# Patient Record
Sex: Female | Born: 1995 | Race: Black or African American | Hispanic: No | Marital: Single | Smoking: Never smoker
Health system: Southern US, Community
[De-identification: ages and names within clinical notes are randomized; demographics above are authoritative.]

## PROBLEM LIST (undated history)

## (undated) DIAGNOSIS — Z3490 Encounter for supervision of normal pregnancy, unspecified, unspecified trimester: Secondary | ICD-10-CM

---

## 2014-09-01 ENCOUNTER — Emergency Department (HOSPITAL_COMMUNITY)
Admission: EM | Admit: 2014-09-01 | Discharge: 2014-09-01 | Disposition: A | Discharge status: Home / Self Care | Payer: Managed Care, Other (non HMO) | Attending: Emergency Medicine | Admitting: Emergency Medicine

## 2014-09-01 ENCOUNTER — Encounter (HOSPITAL_COMMUNITY): Payer: Self-pay | Admitting: Emergency Medicine

## 2014-09-01 DIAGNOSIS — R220 Localized swelling, mass and lump, head: Secondary | ICD-10-CM | POA: Diagnosis not present

## 2014-09-01 DIAGNOSIS — T781XXA Other adverse food reactions, not elsewhere classified, initial encounter: Secondary | ICD-10-CM

## 2014-09-01 DIAGNOSIS — X58XXXA Exposure to other specified factors, initial encounter: Secondary | ICD-10-CM | POA: Insufficient documentation

## 2014-09-01 MED ORDER — PREDNISONE 10 MG PO TABS: ORAL_TABLET | ORAL | Status: AC

## 2014-09-01 MED ORDER — DIPHENHYDRAMINE HCL 25 MG PO TABS: 25.0000 mg | ORAL_TABLET | Freq: Three times a day (TID) | ORAL | Status: AC

## 2014-09-01 MED ORDER — FAMOTIDINE 20 MG PO TABS
20.0000 mg | ORAL_TABLET | Freq: Once | ORAL | Status: AC
  Administered 2014-09-01: 20 mg via ORAL
  Filled 2014-09-01: qty 1

## 2014-09-01 MED ORDER — FAMOTIDINE 20 MG PO TABS: 20.0000 mg | ORAL_TABLET | Freq: Two times a day (BID) | ORAL | Status: AC

## 2014-09-01 MED ORDER — PREDNISONE 20 MG PO TABS
60.0000 mg | ORAL_TABLET | Freq: Once | ORAL | Status: AC
  Administered 2014-09-01: 60 mg via ORAL
  Filled 2014-09-01: qty 3

## 2014-09-01 NOTE — Discharge Instructions (Signed)
Angioedema °Angioedema is a sudden swelling of tissues, often of the skin. It can occur on the face or genitals or in the abdomen or other body parts. The swelling usually develops over a short period and gets better in 24 to 48 hours. It often begins during the night and is found when the person wakes up. The person may also get red, itchy patches of skin (hives). Angioedema can be dangerous if it involves swelling of the air passages.  °Depending on the cause, episodes of angioedema may only happen once, come back in unpredictable patterns, or repeat for several years and then gradually fade away.  °CAUSES  °Angioedema can be caused by an allergic reaction to various triggers. It can also result from nonallergic causes, including reactions to drugs, immune system disorders, viral infections, or an abnormal gene that is passed to you from your parents (hereditary). For some people with angioedema, the cause is unknown.  °Some things that can trigger angioedema include:  °· Foods.   °· Medicines, such as ACE inhibitors, ARBs, nonsteroidal anti-inflammatory agents, or estrogen.   °· Latex.   °· Animal saliva.   °· Insect stings.   °· Dyes used in X-rays.   °· Mild injury.   °· Dental work. °· Surgery. °· Stress.   °· Sudden changes in temperature.   °· Exercise. °SIGNS AND SYMPTOMS  °· Swelling of the skin. °· Hives. If these are present, there is also intense itching. °· Redness in the affected area.   °· Pain in the affected area. °· Swollen lips or tongue. °· Breathing problems. This may happen if the air passages swell. °· Wheezing. °If internal organs are involved, there may be:  °· Nausea.   °· Abdominal pain.   °· Vomiting.   °· Difficulty swallowing.   °· Difficulty passing urine. °DIAGNOSIS  °· Your health care provider will examine the affected area and take a medical and family history. °· Various tests may be done to help determine the cause. Tests may include: °¨ Allergy skin tests to see if the problem  is an allergic reaction.   °¨ Blood tests to check for hereditary angioedema.   °¨ Tests to check for underlying diseases that could cause the condition.   °· A review of your medicines, including over-the-counter medicines, may be done. °TREATMENT  °Treatment will depend on the cause of the angioedema. Possible treatments include:  °· Removal of anything that triggered the condition (such as stopping certain medicines).   °· Medicines to treat symptoms or prevent attacks. Medicines given may include:   °¨ Antihistamines.   °¨ Epinephrine injection.   °¨ Steroids.   °· Hospitalization may be required for severe attacks. If the air passages are affected, it can be an emergency. Tubes may need to be placed to keep the airway open. °HOME CARE INSTRUCTIONS  °· Take all medicines as directed by your health care provider. °· If you were given medicines for emergency allergy treatment, always carry them with you. °· Wear a medical bracelet as directed by your health care provider.   °· Avoid known triggers. °SEEK MEDICAL CARE IF:  °· You have repeat attacks of angioedema.   °· Your attacks are more frequent or more severe despite preventive measures.   °· You have hereditary angioedema and are considering having children. It is important to discuss with your health care provider the risks of passing the condition on to your children. °SEEK IMMEDIATE MEDICAL CARE IF:  °· You have severe swelling of the mouth, tongue, or lips. °· You have difficulty breathing.   °· You have difficulty swallowing.   °· You faint. °MAKE   SURE YOU: °· Understand these instructions. °· Will watch your condition. °· Will get help right away if you are not doing well or get worse. °Document Released: 01/05/2002 Document Revised: 03/13/2014 Document Reviewed: 06/20/2013 °Exit[employer]® Patient Information ©2015 [employer]. This information is not intended to replace advice given to you by your health care provider. Make sure you discuss any questions  you have with your health care provider. ° °

## 2014-09-01 NOTE — ED Provider Notes (Signed)
CSN: [id number]     Arrival date & time 09/01/14  1651 History  This chart was scribed for non-physician practitioner, Felicie Mornavid Johna Kearl, NP-C working with Elwin MochaBlair Walden, MD by Luisa DagoPriscilla Tutu, ED scribe. This patient was seen in room TR08C/TR08C and the patient's care was started at 5:08 PM.     Chief Complaint  Patient presents with  . Allergic Reaction   Patient is a 18 y.o. female presenting with allergic reaction. The history is provided by the patient. No language interpreter was used.  Allergic Reaction Presenting symptoms: swelling   Presenting symptoms: no difficulty breathing, no difficulty swallowing, no itching, no rash and no wheezing   Severity:  Mild Prior allergic episodes:  Food/nut allergies Context: food   Context: no animal exposure, no chemicals, no cosmetics, no grass, no insect bite/sting and no jewelry/metal   Context comment:  Shrimp sauce Relieved by:  Nothing Worsened by:  Nothing tried Ineffective treatments:  Antihistamines  HPI Comments: Jenna Mcneil is a 18 y.o. female who presents to the Emergency Department complaining of an allergic reaction which she first noted today upon waking. Pt states that yesterday she ate a shrimp based sauce which caused her bottom lip to swell. She reports taking Benadryl with minimal relief. Denies any SOB, difficulty breathing, nausea, emesis, abdominal pain, or fever.  History reviewed. No pertinent past medical history. History reviewed. No pertinent past surgical history. History reviewed. No pertinent family history. History  Substance Use Topics  . Smoking status: Never Smoker   . Smokeless tobacco: Not on file  . Alcohol Use: Not on file   OB History   Grav Para Term Preterm Abortions TAB SAB Ect Mult Living                 Review of Systems  Constitutional: Negative for fever, chills, diaphoresis, appetite change and fatigue.  HENT: Positive for facial swelling. Negative for mouth sores, sore throat and trouble  swallowing.   Eyes: Negative for visual disturbance.  Respiratory: Negative for cough, chest tightness, shortness of breath and wheezing.   Cardiovascular: Negative for chest pain.  Gastrointestinal: Negative for nausea, vomiting, abdominal pain, diarrhea and abdominal distention.  Endocrine: Negative for polydipsia, polyphagia and polyuria.  Genitourinary: Negative for dysuria, frequency and hematuria.  Musculoskeletal: Negative for gait problem.  Skin: Negative for color change, itching, pallor and rash.  Neurological: Negative for dizziness, syncope, light-headedness and headaches.  Hematological: Does not bruise/bleed easily.  Psychiatric/Behavioral: Negative for behavioral problems and confusion.  All other systems reviewed and are negative.     Allergies  Shrimp  Home Medications   Prior to Admission medications   Not on File   Triage Vitals: BP 127/72  Pulse 100  Temp(Src) 98.9 F (37.2 C) (Oral)  Resp 14  Ht 5\' 9"  (1.753 m)  Wt 145 lb (65.772 kg)  BMI 21.40 kg/m2  SpO2 99%  LMP 08/13/2014  Physical Exam  Nursing note and vitals reviewed. Constitutional: She appears well-developed and well-nourished. No distress.  HENT:  Head: Normocephalic and atraumatic.  No facial swelling noted.  Eyes: Conjunctivae are normal. Right eye exhibits no discharge. Left eye exhibits no discharge.  Neck: Neck supple.  Cardiovascular: Normal rate, regular rhythm and normal heart sounds.  Exam reveals no gallop and no friction rub.   No murmur heard. Pulmonary/Chest: Effort normal and breath sounds normal. No respiratory distress.  Abdominal: Soft. She exhibits no distension. There is no tenderness.  Musculoskeletal: She exhibits no edema and no tenderness.  Neurological: She is alert.  Skin: Skin is warm and dry.  Psychiatric: She has a normal mood and affect. Her behavior is normal. Thought content normal.    ED Course  Procedures (including critical care time)  DIAGNOSTIC  STUDIES: Oxygen Saturation is 99% on RA, normal by my interpretation.    COORDINATION OF CARE: 5:13 PM- Pt advised of plan for treatment and pt agrees.  Medications  predniSONE (DELTASONE) tablet 60 mg (not administered)  famotidine (PEPCID) tablet 20 mg (not administered)     Labs Review Labs Reviewed - No data to display  Imaging Review No results found.   EKG Interpretation None     Patient presents with mild angioedema of lower lip after eating something with a shrimp-based sauce yesterday. History of shrimp allergy.  Never had shortness of breath, difficulty swallowing, tongue swelling.  Will treat with prednisone, pepcid.  Continuation at home along with benadryl.  Return precautions discussed. MDM   Final diagnoses:  None    Mild allergic reaction to food presenting with minimal lower lip angioedema.  I personally performed the services described in this documentation, which was scribed in my presence. The recorded information has been reviewed and is accurate.    Jimmye Normanavid John Trini Christiansen, NP 09/02/14 [phone]

## 2014-09-01 NOTE — ED Notes (Signed)
Pt told to return with new or worsening symptoms.

## 2014-09-01 NOTE — ED Notes (Signed)
Pt in stating she ate some shrimp yesterday and today she woke up with a swollen upper lip, pt states she took benadryl at home but symptoms continues, no other symptoms

## 2014-09-02 NOTE — ED Provider Notes (Signed)
Medical screening examination/treatment/procedure(s) were performed by non-physician practitioner and as supervising physician I was immediately available for consultation/collaboration.   EKG Interpretation None        Elwin MochaBlair Belinda Bringhurst, MD 09/02/14 [phone]

## 2015-05-02 ENCOUNTER — Telehealth: Payer: Self-pay | Admitting: *Deleted

## 2015-05-02 ENCOUNTER — Emergency Department (HOSPITAL_COMMUNITY): Payer: Medicaid Other

## 2015-05-02 ENCOUNTER — Emergency Department (HOSPITAL_COMMUNITY)
Admission: EM | Admit: 2015-05-02 | Discharge: 2015-05-02 | Disposition: A | Discharge status: Home / Self Care | Payer: Medicaid Other | Attending: Emergency Medicine | Admitting: Emergency Medicine

## 2015-05-02 ENCOUNTER — Encounter (HOSPITAL_COMMUNITY): Payer: Self-pay | Admitting: Emergency Medicine

## 2015-05-02 DIAGNOSIS — O209 Hemorrhage in early pregnancy, unspecified: Secondary | ICD-10-CM | POA: Insufficient documentation

## 2015-05-02 DIAGNOSIS — O418X11 Other specified disorders of amniotic fluid and membranes, first trimester, fetus 1: Secondary | ICD-10-CM | POA: Diagnosis not present

## 2015-05-02 DIAGNOSIS — Z3A09 9 weeks gestation of pregnancy: Secondary | ICD-10-CM | POA: Insufficient documentation

## 2015-05-02 DIAGNOSIS — Z79899 Other long term (current) drug therapy: Secondary | ICD-10-CM | POA: Diagnosis not present

## 2015-05-02 DIAGNOSIS — O468X1 Other antepartum hemorrhage, first trimester: Secondary | ICD-10-CM

## 2015-05-02 DIAGNOSIS — O418X10 Other specified disorders of amniotic fluid and membranes, first trimester, not applicable or unspecified: Secondary | ICD-10-CM

## 2015-05-02 DIAGNOSIS — O4690 Antepartum hemorrhage, unspecified, unspecified trimester: Secondary | ICD-10-CM

## 2015-05-02 LAB — CBC WITH DIFFERENTIAL/PLATELET
BASOS PCT: 0 % (ref 0–1)
Basophils Absolute: 0 10*3/uL (ref 0.0–0.1)
EOS PCT: 1 % (ref 0–5)
Eosinophils Absolute: 0.1 10*3/uL (ref 0.0–0.7)
HCT: 33.3 % — ABNORMAL LOW (ref 36.0–46.0)
HEMOGLOBIN: 11 g/dL — AB (ref 12.0–15.0)
LYMPHS ABS: 2.8 10*3/uL (ref 0.7–4.0)
Lymphocytes Relative: 31 % (ref 12–46)
MCH: 23.9 pg — AB (ref 26.0–34.0)
MCHC: 33 g/dL (ref 30.0–36.0)
MCV: 72.2 fL — AB (ref 78.0–100.0)
MONO ABS: 0.8 10*3/uL (ref 0.1–1.0)
Monocytes Relative: 9 % (ref 3–12)
Neutro Abs: 5.2 10*3/uL (ref 1.7–7.7)
Neutrophils Relative %: 59 % (ref 43–77)
Platelets: 221 10*3/uL (ref 150–400)
RBC: 4.61 MIL/uL (ref 3.87–5.11)
RDW: 15 % (ref 11.5–15.5)
WBC: 8.9 10*3/uL (ref 4.0–10.5)

## 2015-05-02 LAB — WET PREP, GENITAL
CLUE CELLS WET PREP: NONE SEEN
TRICH WET PREP: NONE SEEN
YEAST WET PREP: NONE SEEN

## 2015-05-02 LAB — GC/CHLAMYDIA PROBE AMP (~~LOC~~) NOT AT ARMC
CHLAMYDIA, DNA PROBE: NEGATIVE
NEISSERIA GONORRHEA: NEGATIVE

## 2015-05-02 LAB — HIV ANTIBODY (ROUTINE TESTING W REFLEX): HIV Screen 4th Generation wRfx: NONREACTIVE

## 2015-05-02 LAB — HCG, SERUM, QUALITATIVE: Preg, Serum: POSITIVE — AB

## 2015-05-02 LAB — HCG, QUANTITATIVE, PREGNANCY: hCG, Beta Chain, Quant, S: 80148 m[IU]/mL — ABNORMAL HIGH (ref <5)

## 2015-05-02 LAB — ABO/RH: ABO/RH(D): O POS

## 2015-05-02 LAB — RPR: RPR: NONREACTIVE

## 2015-05-02 NOTE — ED Notes (Addendum)
Patient transported to US 

## 2015-05-02 NOTE — Discharge Instructions (Signed)
Your ultrasound today shows a subchorionic hematoma which is the source of your bleeding.  Your pregnancy at  this time appears to be viable with a good strong heart beat seen on ultrasound.  You are at higher risk for miscarriage, however.  No tampons, douching, sexual intercourse until you have been cleared by her OB.  Contact them in the morning for follow-up.   Pelvic Rest Pelvic rest is sometimes recommended for women when:   The placenta is partially or completely covering the opening of the cervix (placenta previa).  There is bleeding between the uterine wall and the amniotic sac in the first trimester (subchorionic hemorrhage).  The cervix begins to open without labor starting (incompetent cervix, cervical insufficiency).  The labor is too early (preterm labor). HOME CARE INSTRUCTIONS  Do not have sexual intercourse, stimulation, or an orgasm.  Do not use tampons, douche, or put anything in the vagina.  Do not lift anything over 10 pounds (4.5 kg).  Avoid strenuous activity or straining your pelvic muscles. SEEK MEDICAL CARE IF:  You have any vaginal bleeding during pregnancy. Treat this as a potential emergency.  You have cramping pain felt low in the stomach (stronger than menstrual cramps).  You notice vaginal discharge (watery, mucus, or bloody).  You have a low, dull backache.  There are regular contractions or uterine tightening. SEEK IMMEDIATE MEDICAL CARE IF: You have vaginal bleeding and have placenta previa.  Document Released: 02/21/2011 Document Revised: 01/19/2012 Document Reviewed: 02/21/2011 [facility] Patient Information 2015 [place], [place]. This information is not intended to replace advice given to you by your health care provider. Make sure you discuss any questions you have with your health care provider.  Subchorionic Hematoma A subchorionic hematoma is a gathering of blood between the outer wall of the placenta and the inner wall of the womb  (uterus). The placenta is the organ that connects the fetus to the wall of the uterus. The placenta performs the feeding, breathing (oxygen to the fetus), and waste removal (excretory work) of the fetus.  Subchorionic hematoma is the most common abnormality found on a result from ultrasonography done during the first trimester or early second trimester of pregnancy. If there has been little or no vaginal bleeding, early small hematomas usually shrink on their own and do not affect your baby or pregnancy. The blood is gradually absorbed over 1-2 weeks. When bleeding starts later in pregnancy or the hematoma is larger or occurs in an older pregnant woman, the outcome may not be as good. Larger hematomas may get bigger, which increases the chances for miscarriage. Subchorionic hematoma also increases the risk of premature detachment of the placenta from the uterus, preterm (premature) labor, and stillbirth. HOME CARE INSTRUCTIONS  Stay on bed rest if your health care provider recommends this. Although bed rest will not prevent more bleeding or prevent a miscarriage, your health care provider may recommend bed rest until you are advised otherwise.  Avoid heavy lifting (more than 10 lb [4.5 kg]), exercise, sexual intercourse, or douching as directed by your health care provider.  Keep track of the number of pads you use each day and how soaked (saturated) they are. Write down this information.  Do not use tampons.  Keep all follow-up appointments as directed by your health care provider. Your health care provider may ask you to have follow-up blood tests or ultrasound tests or both. SEEK IMMEDIATE MEDICAL CARE IF:  You have severe cramps in your stomach, back, abdomen, or pelvis.  You have a fever.  You pass large clots or tissue. Save any tissue for your health care provider to look at.  Your bleeding increases or you become lightheaded, feel weak, or have fainting episodes. Document Released:  02/11/2007 Document Revised: 03/13/2014 Document Reviewed: 05/26/2013 [facility] Patient Information 2015 [place], [place]. This information is not intended to replace advice given to you by your health care provider. Make sure you discuss any questions you have with your health care provider.  Vaginal Bleeding During Pregnancy, First Trimester A small amount of bleeding (spotting) from the vagina is common in early pregnancy. Sometimes the bleeding is normal and is not a problem, and sometimes it is a sign of something serious. Be sure to tell your doctor about any bleeding from your vagina right away. HOME CARE  Watch your condition for any changes.  Follow your doctor's instructions about how active you can be.  If you are on bed rest:  You may need to stay in bed and only get up to use the bathroom.  You may be allowed to do some activities.  If you need help, make plans for someone to help you.  Write down:  The number of pads you use each day.  How often you change pads.  How soaked (saturated) your pads are.  Do not use tampons.  Do not douche.  Do not have sex or orgasms until your doctor says it is okay.  If you pass any tissue from your vagina, save the tissue so you can show it to your doctor.  Only take medicines as told by your doctor.  Do not take aspirin because it can make you bleed.  Keep all follow-up visits as told by your doctor. GET HELP IF:   You bleed from your vagina.  You have cramps.  You have labor pains.  You have a fever that does not go away after you take medicine. GET HELP RIGHT AWAY IF:   You have very bad cramps in your back or belly (abdomen).  You pass large clots or tissue from your vagina.  You bleed more.  You feel light-headed or weak.  You pass out (faint).  You have chills.  You are leaking fluid or have a gush of fluid from your vagina.  You pass out while pooping (having a bowel movement). MAKE SURE  YOU:  Understand these instructions.  Will watch your condition.  Will get help right away if you are not doing well or get worse. Document Released: 03/13/2014 Document Reviewed: 07/04/2013 [facility] Patient Information 2015 [place], [place]. This information is not intended to replace advice given to you by your health care provider. Make sure you discuss any questions you have with your health care provider.

## 2015-05-02 NOTE — ED Notes (Signed)
Pt. reports vaginal bleeding onset this evening , denies abdominal cramping , no fever or chills, pt. stated she is [redacted] weeks pregnant (G2P0).

## 2015-05-02 NOTE — ED Provider Notes (Signed)
CSN: [id number]     Arrival date & time 05/02/15  0051 History  This chart was scribed for Marisa Severin, MD by Annye Asa, ED Scribe. This patient was seen in room D35C/D35C and the patient's care was started at 1:16 AM.    Chief Complaint  Patient presents with  . Vaginal Bleeding   The history is provided by the patient. No language interpreter was used.     HPI Comments: Jenna Mcneil is a [redacted] weeks pregnant 19 y.o. female who presents to the Emergency Department complaining of 1 hour of vaginal bleeding. She describes the amount of bleeding as similar to a period; she explains that this has been continuous since onset, rather than only noted on wiping. Patient's OBGYN in [place] recommended she come to the ED tonight. She denies abdominal cramping, fever, chills.   G2 P0 A1; miscarriage in January 2016. LNMP 02/23/15. Patient is unsure of her blood type.   History reviewed. No pertinent past medical history. History reviewed. No pertinent past surgical history. No family history on file. History  Substance Use Topics  . Smoking status: Never Smoker   . Smokeless tobacco: Not on file  . Alcohol Use: No   OB History    Gravida Para Term Preterm AB TAB SAB Ectopic Multiple Living   2 0             Review of Systems  Constitutional: Negative for fever and chills.  Gastrointestinal: Negative for abdominal pain.  Genitourinary: Positive for vaginal bleeding.  All other systems reviewed and are negative.  Allergies  Shrimp  Home Medications   Prior to Admission medications   Medication Sig Start Date End Date Taking? Authorizing Provider  diphenhydrAMINE (BENADRYL) 25 MG tablet Take 1 tablet (25 mg total) by mouth every 8 (eight) hours. 09/01/14   Felicie Morn, NP  diphenhydrAMINE (BENADRYL) 25 MG tablet Take 25 mg by mouth once.    Historical Provider, MD  famotidine (PEPCID) 20 MG tablet Take 1 tablet (20 mg total) by mouth 2 (two) times daily. 09/01/14   Felicie Morn, NP   predniSONE (DELTASONE) 10 MG tablet 2 tablets daily x 4 days. 09/01/14   Felicie Morn, NP   BP 154/93 mmHg  Pulse 93  Temp(Src) 98.7 F (37.1 C) (Oral)  Resp 16  SpO2 97%  LMP 02/23/2015 Physical Exam  Constitutional: She is oriented to person, place, and time. She appears well-developed and well-nourished. No distress.  HENT:  Head: Normocephalic and atraumatic.  Mouth/Throat: Oropharynx is clear and moist. No oropharyngeal exudate.  Moist mucous membranes  Eyes: EOM are normal. Pupils are equal, round, and reactive to light.  Neck: Normal range of motion. Neck supple. No JVD present.  Cardiovascular: Normal rate, regular rhythm and normal heart sounds.  Exam reveals no gallop and no friction rub.   No murmur heard. Pulmonary/Chest: Effort normal and breath sounds normal. No respiratory distress. She has no wheezes. She has no rales.  Abdominal: Soft. Bowel sounds are normal. She exhibits no mass. There is no tenderness. There is no rebound and no guarding.  Genitourinary:  External genitalia within normal limits Vagina with bloody discharge Cervix  Small amount of blood at os, but closed negative for cervical motion tenderness Adnexa palpated, no masses or negative for tenderness noted Bladder palpated negative for tenderness Uterus palpated gravid masses or negative for tenderness    Musculoskeletal: Normal range of motion. She exhibits no edema.  Moves all extremities normally.   Lymphadenopathy:  She has no cervical adenopathy.  Neurological: She is alert and oriented to person, place, and time. She displays normal reflexes.  Skin: Skin is warm and dry. No rash noted.  Psychiatric: She has a normal mood and affect. Her behavior is normal.  Nursing note and vitals reviewed.   ED Course  Procedures   DIAGNOSTIC STUDIES: Oxygen Saturation is 97% on RA, adequate by my interpretation.    COORDINATION OF CARE: 1:20 AM Discussed treatment plan with pt at bedside and  pt agreed to plan.   Labs Review Labs Reviewed  WET PREP, GENITAL - Abnormal; Notable for the following:    WBC, Wet Prep HPF POC FEW (*)    All other components within normal limits  HCG, SERUM, QUALITATIVE - Abnormal; Notable for the following:    Preg, Serum POSITIVE (*)    All other components within normal limits  CBC WITH DIFFERENTIAL/PLATELET - Abnormal; Notable for the following:    Hemoglobin 11.0 (*)    HCT 33.3 (*)    MCV 72.2 (*)    MCH 23.9 (*)    All other components within normal limits  HCG, QUANTITATIVE, PREGNANCY - Abnormal; Notable for the following:    hCG, Beta Chain, Quant, S [address]109 (*)    All other components within normal limits  RPR  HIV ANTIBODY (ROUTINE TESTING)  ABO/RH  GC/CHLAMYDIA PROBE AMP ([place]) NOT AT [facility]    Imaging Review US Ob Comp Less 14 Wks  05/02/2015   CLINICAL DATA:  Patient with heavy vaginal bleeding.  EXAM: OBSTETRIC <14 WK [place] AND TRANSVAGINAL OB US  TECHNIQUE: Both transabdominal and transvaginal ultrasound examinations were performed for complete evaluation of the gestation as well as the maternal uterus, adnexal regions, and pelvic cul-de-sac. Transvaginal technique was performed to assess early pregnancy.  COMPARISON:  None.  FINDINGS: Intrauterine gestational sac: Visualized/normal in shape.  Yolk sac:  Present  Embryo:  Present  Cardiac Activity: Present  Heart Rate: 175  bpm  CRL:  34.2  mm   10 w   2 d                  [place] EDC: 11/26/2015  Maternal uterus/adnexae: Moderate to large subchorionic hemorrhage. The right and left ovaries are unremarkable. No free fluid in the pelvis.  IMPRESSION: Single live intrauterine gestation 9 weeks 5 days by LMP.  Moderate subchorionic hemorrhage.   Electronically Signed   By: Annia Belt M.D.   On: 05/02/2015 03:21   US Ob Transvaginal  05/02/2015   CLINICAL DATA:  Patient with heavy vaginal bleeding.  EXAM: OBSTETRIC <14 WK [place] AND TRANSVAGINAL OB US  TECHNIQUE: Both transabdominal and  transvaginal ultrasound examinations were performed for complete evaluation of the gestation as well as the maternal uterus, adnexal regions, and pelvic cul-de-sac. Transvaginal technique was performed to assess early pregnancy.  COMPARISON:  None.  FINDINGS: Intrauterine gestational sac: Visualized/normal in shape.  Yolk sac:  Present  Embryo:  Present  Cardiac Activity: Present  Heart Rate: 175  bpm  CRL:  34.2  mm   10 w   2 d                  [place] EDC: 11/26/2015  Maternal uterus/adnexae: Moderate to large subchorionic hemorrhage. The right and left ovaries are unremarkable. No free fluid in the pelvis.  IMPRESSION: Single live intrauterine gestation 9 weeks 5 days by LMP.  Moderate subchorionic hemorrhage.   Electronically Signed   By:  Annia Belt M.D.   On: 05/02/2015 03:21     EKG Interpretation None      MDM   Final diagnoses:  Vaginal bleeding in pregnancy  Subchorionic bleed, first trimester   I personally performed the services described in this documentation, which was scribed in my presence. The recorded information has been reviewed and is accurate.  19 yo G2P1 at 102w5d by LMP.  Prior u/s with IUP per patient.  Onset of bleeding tonight, no pain.  Os is closed on exam, but moderate blood in vault.  Plan for tv US     Marisa Severin, MD 05/02/15 [phone]

## 2015-05-02 NOTE — Telephone Encounter (Signed)
Someone called on behalf of patient and stated that she was seen in the emergency room last night and had vaginal bleeding. Her obgyn is in [place] and she wants to see if we can just see her today to figure out what is going on. I called both numbers that were left on vm and got no answer.

## 2015-10-05 ENCOUNTER — Emergency Department (HOSPITAL_COMMUNITY)
Admission: EM | Admit: 2015-10-05 | Discharge: 2015-10-05 | Discharge status: Against Medical Advice | Payer: Managed Care, Other (non HMO) | Attending: Emergency Medicine | Admitting: Emergency Medicine

## 2015-10-05 ENCOUNTER — Encounter (HOSPITAL_COMMUNITY): Payer: Self-pay | Admitting: Emergency Medicine

## 2015-10-05 DIAGNOSIS — Z3A32 32 weeks gestation of pregnancy: Secondary | ICD-10-CM | POA: Insufficient documentation

## 2015-10-05 DIAGNOSIS — R040 Epistaxis: Secondary | ICD-10-CM | POA: Insufficient documentation

## 2015-10-05 DIAGNOSIS — O9989 Other specified diseases and conditions complicating pregnancy, childbirth and the puerperium: Secondary | ICD-10-CM | POA: Insufficient documentation

## 2015-10-05 HISTORY — DX: Encounter for supervision of normal pregnancy, unspecified, unspecified trimester: Z34.90

## 2015-10-05 MED ORDER — OXYMETAZOLINE HCL 0.05 % NA SOLN
1.0000 | Freq: Once | NASAL | Status: DC
Start: 2015-10-05 — End: 2015-10-05

## 2015-10-05 NOTE — ED Notes (Signed)
Pt. Stated, Im 8 months pregnant and I had a nosebleed that lasted a hour.  Its stopped now.

## 2016-01-03 IMAGING — US US OB TRANSVAGINAL
1 series · 14 of 28 positions shown · non-contrast
Comparison: None.

CLINICAL DATA: Patient with heavy vaginal bleeding.

EXAM:
OBSTETRIC <14 WK US AND TRANSVAGINAL OB US
TECHNIQUE: Both transabdominal and transvaginal ultrasound examinations were
performed for complete evaluation of the gestation as well as the
maternal uterus, adnexal regions, and pelvic cul-de-sac.
Transvaginal technique was performed to assess early pregnancy.

[Series 1: us ob transvaginal · 0.22mm/px · 14 of 52 slices shown]
[im 2/52]
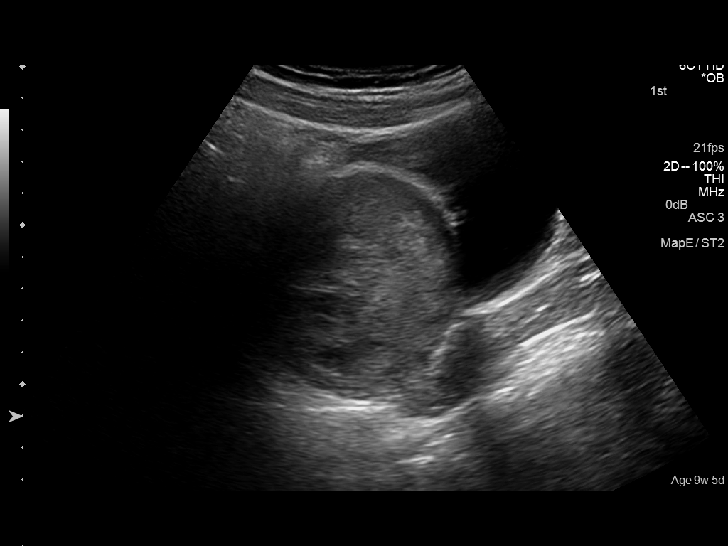
[im 6/52]
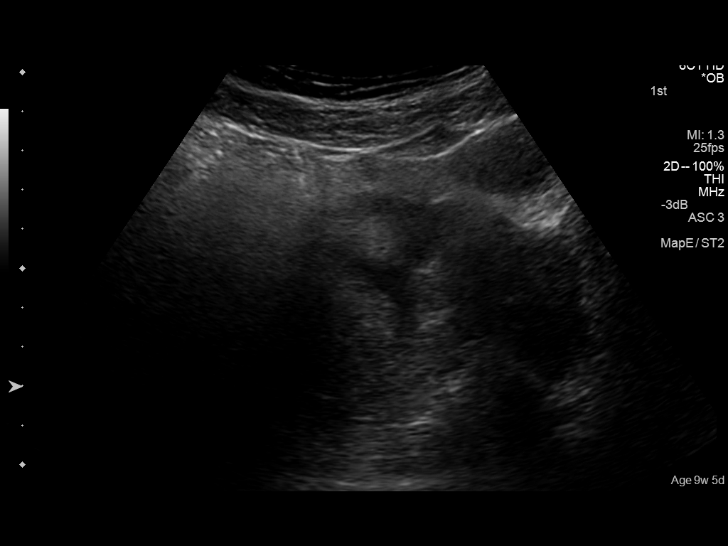
[im 10/52]
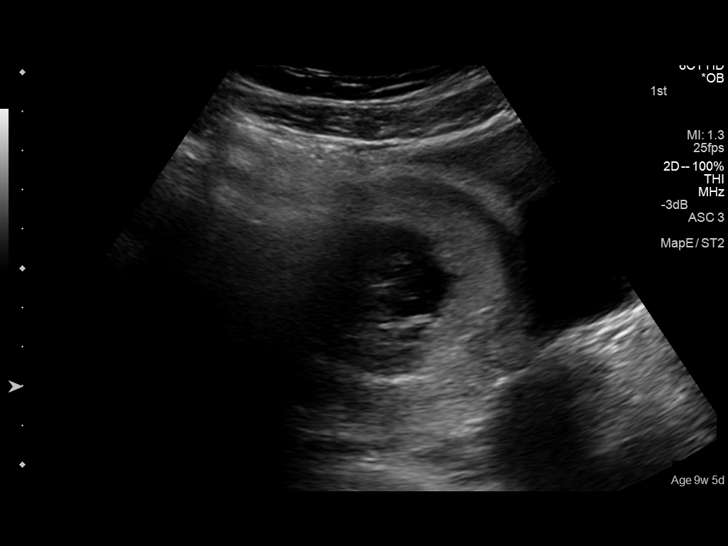
[im 14/52]
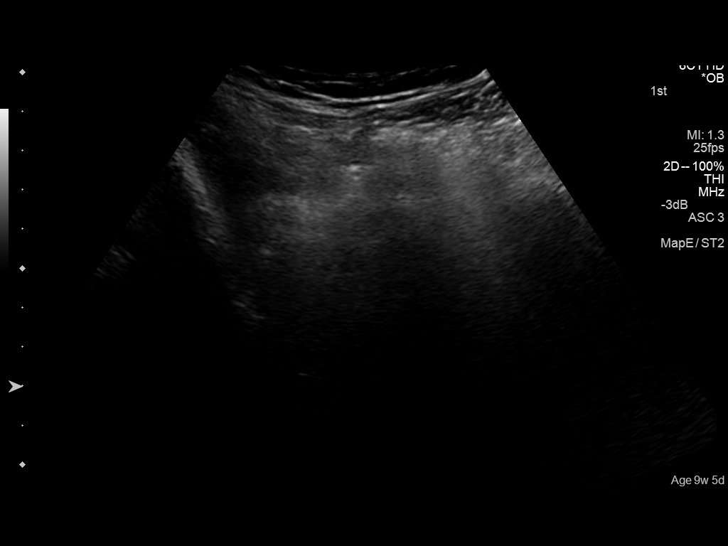
[im 18/52]
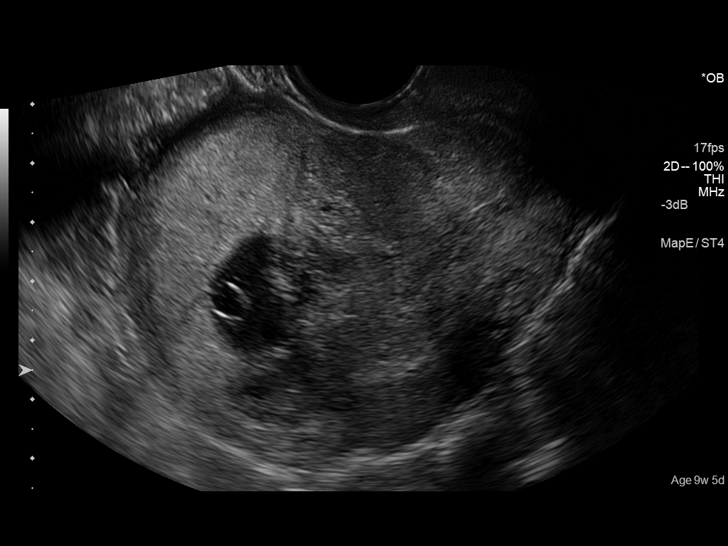
[im 21/52]
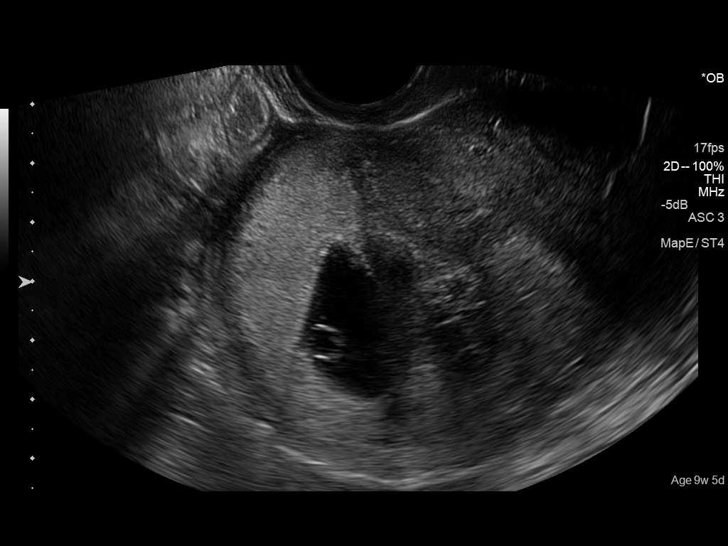
[im 25/52]
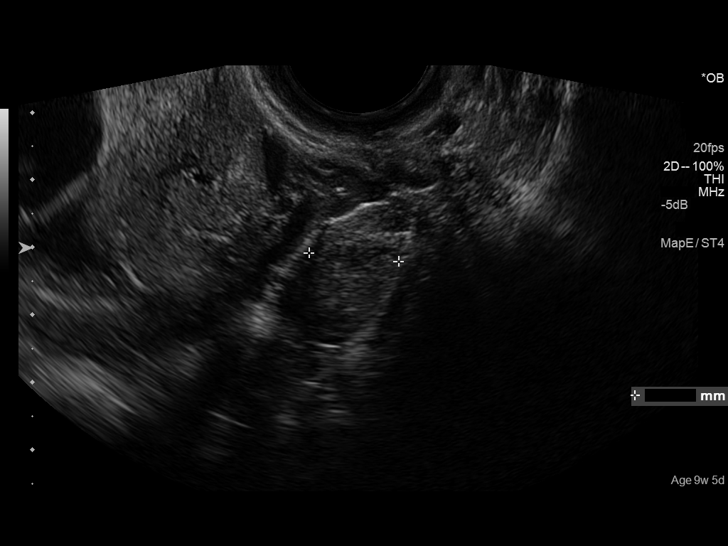
[im 29/52]
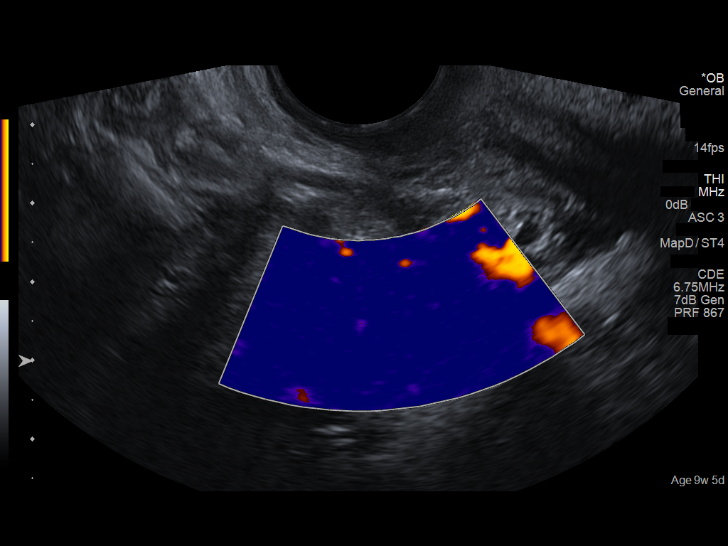
[im 33/52]
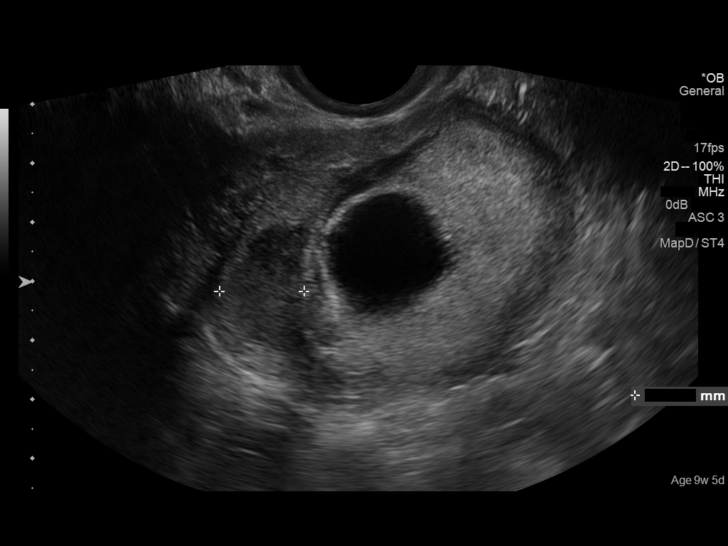
[im 36/52]
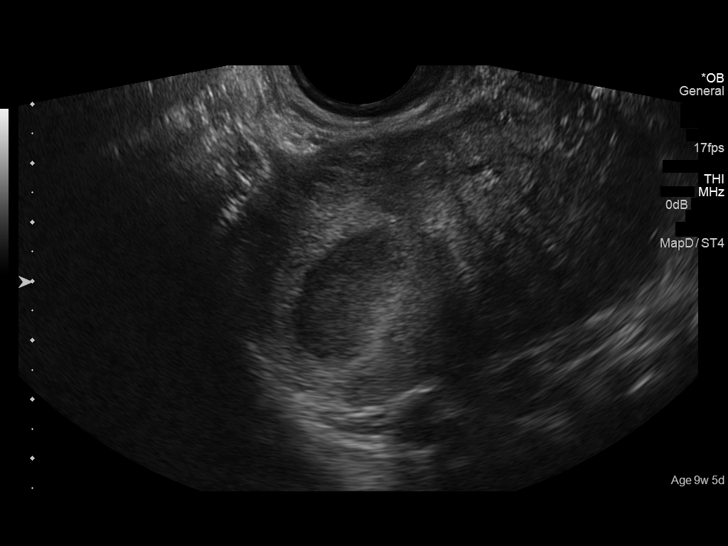
[im 40/52]
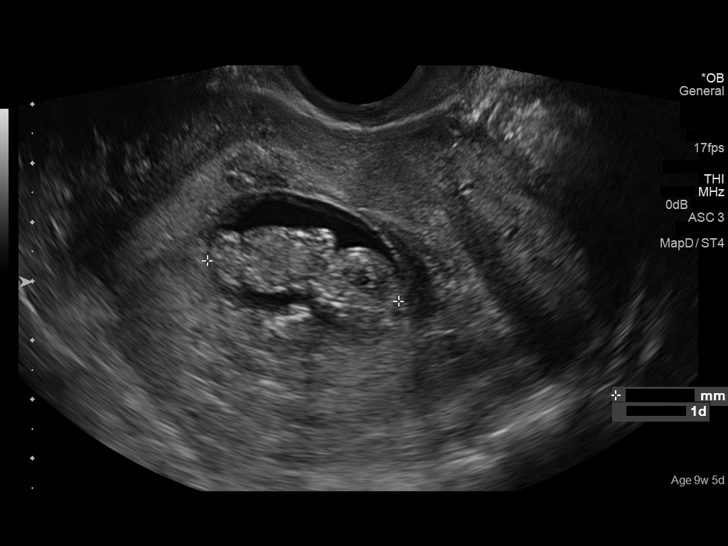
[im 44/52]
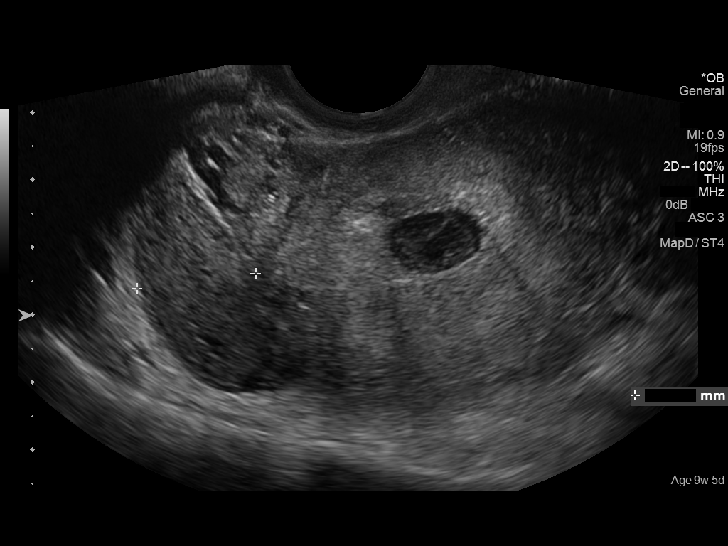
[im 48/52]
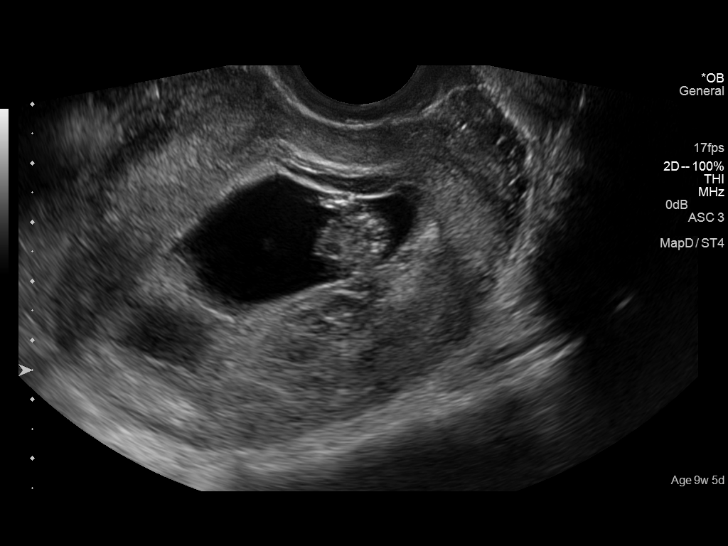
[im 52/52]
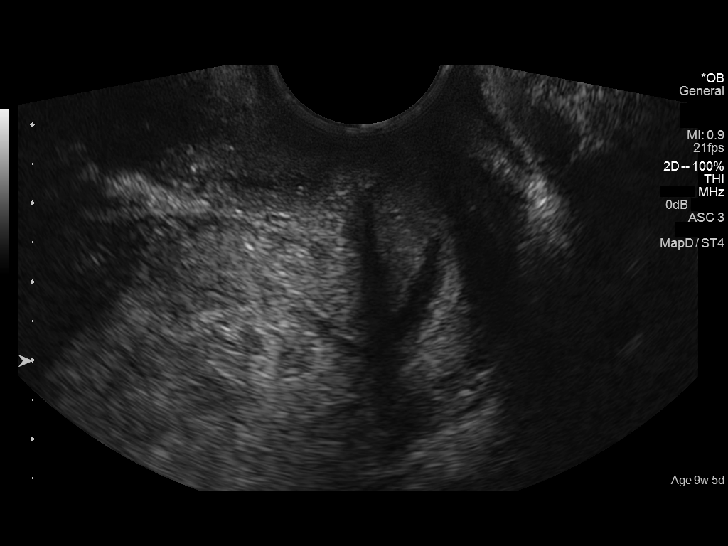

[14 of 28 positions shown; findings below may reference images not displayed]

FINDINGS: Intrauterine gestational sac: Visualized/normal in shape.

Yolk sac:  Present

Embryo:  Present

Cardiac Activity: Present

Heart Rate: 175  bpm

CRL:  34.2  mm   10 w   2 d                  US EDC: 11/26/2015

Maternal uterus/adnexae: Moderate to large subchorionic hemorrhage.
The right and left ovaries are unremarkable. No free fluid in the
pelvis.
IMPRESSION: Single live intrauterine gestation 9 weeks 5 days by LMP.

Moderate subchorionic hemorrhage.
